# Patient Record
Sex: Male | Born: 1965 | Race: White | Hispanic: No | Marital: Single | Smoking: Never smoker
Health system: Southern US, Community
[De-identification: ages and names within clinical notes are randomized; demographics above are authoritative.]

## PROBLEM LIST (undated history)

## (undated) DIAGNOSIS — I10 Essential (primary) hypertension: Secondary | ICD-10-CM

## (undated) DIAGNOSIS — N200 Calculus of kidney: Secondary | ICD-10-CM

## (undated) DIAGNOSIS — M5432 Sciatica, left side: Secondary | ICD-10-CM

## (undated) HISTORY — PX: HERNIA REPAIR: SHX51

## (undated) HISTORY — PX: SHOULDER SURGERY: SHX246

---

## 2015-11-11 ENCOUNTER — Inpatient Hospital Stay: Admit: 2015-11-11 | Discharge: 2015-11-11 | Payer: PRIVATE HEALTH INSURANCE | Primary: Family Medicine

## 2015-11-11 DIAGNOSIS — M5432 Sciatica, left side: Secondary | ICD-10-CM

## 2015-11-11 NOTE — Progress Notes (Signed)
[facility]  [address] [place], [place] [id number]  Ph [phone]) [phone]  Fx [phone]    Plan of Care/ Statement of Necessity for Physical Therapy Services    Patient name: Joshua Palmer Start of Care: 11/11/2015   Referral source: Windell NorfolkBarron, Natalie, MD DOB: 08/02/1966    Medical Diagnosis: Sciatica, left side [M54.32]   Onset Date:chronic (1991 initial onset)    Treatment Diagnosis: back pain   Prior Hospitalization: see medical history Provider#: [id number]   Medications: Verified on Patient summary List    Comorbidities: none reported   Prior Level of Function: chronic back pain with ADL's      The Plan of Care and following information is based on the information from the initial evaluation.  Assessment/ key information: Pt is a 49 yo male presenting to clinic with c/o chronic back pain original onset 1991 after gym activity. Pain exacerbated 3 years ago after fall on ice and since this time pt has had radiating pain into left buttock, but not into LE otherwise. On exam, mechanical spine assessment with repeated movements testing reveals a tentative flexion bias of lumbar spine.  Innominates currently aligned and stable.    Problem List: pain affecting function, decrease ROM, decrease strength, decrease ADL/ functional abilitiies, decrease activity tolerance and decrease flexibility/ joint mobility   Treatment Plan may include any combination of the following: Therapeutic exercise, Therapeutic activities, Neuromuscular re-education, Physical agent/modality, Manual therapy, Aquatic therapy and Patient education  Patient / Family readiness to learn indicated by: asking questions, trying to perform skills and interest  Persons(s) to be included in education: patient (P)  Barriers to Learning/Limitations: None  Patient Goal (s): ???less pain???  Patient Self Reported Health Status: good  Rehabilitation Potential: good    Short Term Goals: To be accomplished in 2 weeks:   1. Patient will be independent and compliant with HEP to achieve other goals.  Status at eval: not independent/compliant  2. Pt willl report >/=25% improvement in symptoms to increase activity/position tolerance.  Status at eval: 0%    Long Term Goals: To be accomplished in 4 weeks:  1. Improve FOTO score to >/= 74/100 to indicate decreased pain with ADL's.  Status at eval:   2. Pt willl report >/=25% improvement in symptoms to increase activity/position tolerance.  Status at eval: 0%  3. Pt will have full, pain-free lumbar AROM to normalize ADL's.  Status at eval: limited and painful    Frequency / Duration: Patient to be seen 2 times per week for 4 weeks.    Patient/ Caregiver education and instruction: Diagnosis, prognosis, exercises     Plan of care has been reviewed with PTA    Damaris SchoonerStacye Wilian Kwong, PT 11/11/2015 11:07 AM    ________________________________________________________________________    I certify that the above Therapy Services are being furnished while the patient is under my care. I agree with the treatment plan and certify that this therapy is necessary.    Physician's Signature:____________________  Date:____________Time: _________    Please sign and return to [facility]  [address] [place], [place] [id number]  Ph [phone]) [phone]  Fx [phone]

## 2015-11-11 NOTE — Progress Notes (Signed)
PT DAILY TREATMENT NOTE 3-16    Patient Name: Joshua Palmer  Date:11/11/2015  DOB: 03/26/1966    Patient DOB Verified  Payor: OPTIMA / [place] / Product Type: HMO /    In time:1025  Out time:1100  Total Treatment Time (min): 35  Visit #: 1 of 8    Treatment Area: Sciatica, left side [M54.32]    SUBJECTIVE  Pain Level (0-10 scale): 5/10 seated at rest into left buttock  Any medication changes, allergies to medications, adverse drug reactions, diagnosis change, or new procedure performed?:  No     Yes (see summary sheet for update)  Subjective functional status/changes:    No changes reported  See POC    OBJECTIVE    Modality rationale:    Min Type Additional Details     Estim:  Unatt       IFC  Premod                        Other:  w/ice   w/heat  Position:  Location:     Estim: Att    TENS instruct  NMES                    Other:  w/US   w/ice   w/heat  Position:  Location:      Traction:  Cervical       Lumbar                        Prone          Supine                       Intermittent   Continuous Lbs:   before manual   after manual      Ultrasound: Continuous    Pulsed                             W/cm2:  Location:      Iontophoresis with dexamethasone         Location:  Take home patch    In clinic      Ice       heat    Ice massage    Laser     Anodyne Position:  Location:      Laser with stim    Other:  Position:  Location:      Vasopneumatic Device Pressure:        lo  med  hi   Temperature:  lo  med  hi    Skin assessment post-treatment:  intact redness- no adverse reaction    redness ??? adverse reaction:     27 min Eval                  Re-Eval       8 min Therapeutic Exercise:   See flow sheet : issued and reviewed HEP   Rationale: decrease pain and determine directional preference to improve the patient???s ability to normalize function     min Therapeutic Activity:    See flow sheet :         min Neuromuscular Re-education:    See flow sheet :        min Manual Therapy:           min Gait Training:  ___ feet  with ___ device on level surfaces with ___ level of assist   Rationale:          With    TE    TA    neuro    other: Patient Education:  Review HEP     Progressed/Changed HEP based on:    positioning    body mechanics    transfers    heat/ice application     other:      Other Objective/Functional Measures:    Physical Therapy Evaluation - Lumbar Spine ([facility])    SUBJECTIVE  Chief Complaint: Pt c/o chronic back pain original onset 1991 after gym activity.  Pain exacerbated 3 years ago after fall on ice and since this time pt has had radiating pain into left buttock, but not into LE otherwise.    Mechanism of injury:    Symptoms:  Pain rating (0-10):   Today:    Best:    Worst:     Contstant: varies in intensity     Intermittent:     Aggravated by:    Bending  Sitting  Standing  Walking    Moving  Cough  Sneeze  Valsalva    AM   PM  Lying:   sup    pro    sidelying    Other: running     Eased by:     Bending  Sitting  Standing  Walking    Moving  AM   PM  Lying:  sup   pro   sidelying    Other:     General Health:  Red Flags Indicated?  Yes     No   Yes  No Recent weight change (If yes, due to dieting?  Yes   No)    Yes  No Weakness in legs during walking   Yes  No Unremitting pain at night   Yes  No Abdominal pain or problems   Yes  No Rectal bleeding   Yes  No Feet more cold or painful in cold weather   Yes  No Menstrual irregularities   Yes  No Blood or pain with urination   Yes  No Dysfunction of bowel or bladder   Yes  No Recent illness within past 3 weeks (i.e, cold, flu)   Yes  No Numbness/tingling in buttock/genitalia region    Past History/Treatments:     Diagnostic Tests:  Lab work  X-rays     CT  MRI      Other:  Results: no diagnostic tests performed    Functional Status  Prior level of function: chronic back pain with ADL's  Present functional limitations: job duties as an Programme researcher, broadcasting/film/videoautomechanic, biking  What position do you sleep in?: supine vs S/L    OBJECTIVE  Posture:   Lateral Shift:  R     L      +   -  Kyphosis:  Increased  Decreased     WNL  Lordosis:   Increased  Decreased    WNL  Pelvic symmetry:  WNL     Other:    Gait:   Normal      Abnormal:    Active Movements:  N/A    Too acute    Other:  ROM % AROM % PROM Comments:pain, area   Forward flexion 40-60      Extension 20-30      SB right 20-30      SB left 20-30  Rotation right 5-10      Rotation left 5-10        Repeated Movements   Effects on present pain: produces (PR), abolishes (A), increases (incr), decreases (decr), centralizes (C), peripheral (PH), no effect (NE)   Pre-Test Sx Flexion Repeated Flexion Extension Repeated Extension Repeated SBL Repeated SBR   Sitting 1. 5/10 into left buttock  4.NE       Standing 2. NE    3.NE     Lying 5.H/L supine: increased pain and more so with LE's straight    5. Prone lie: increases pain  N/A N/A   Comments:i  Side Glide:  Sustained passive positioning test:    Neuro Screen  WNL  Myotome/Dermatome/Reflexes:  Comments:    Dural Mobility:  SLR Sitting:  R     L     +     -  @ (degrees):           Supine:  R     L     +     -  @ (degrees):   Slump Test:  R     L     +     -  @ (degrees):   Prone Knee Bend:  R     L     +     -     Palpation   Min   Mod   Severe    Location: minimal tenderness to palpation bilateral lumbar paraspinal and PSIS region   Min   Mod   Severe    Location:   Min   Mod   Severe    Location:    Stabilization Tests  Multifidus Test  Level 1: Prone abdominal draw in (Goal 6-53mmHG):  Level 2: Supported leg load supine (needle deflection at ):  Yes   No   Level 3: Unsupported leg load supine (needle deflection at ):  Yes   No     Strength   L(0-5) R (0-5) N/T   Hip Flexion (L1,2)      Knee Extension (L3,4)      Ankle Dorsiflexion (L4)      Great Toe Extension (L5)      Ankle Plantarflexion (S1)      Knee Flexion (S1,2)      Upper Abdominals      Lower Abdominals      Paraspinals      Back Rotators      Gluteus Maximus      Other         Special Tests  Lumbar:  Lumb. Compression:  Pos   Neg               Lumbar Distraction:    Pos   Neg    Quadrant:   Pos   Neg    Flex   Ext    Sacroilliac:  Gaenslen's:  R     L     +     -     Compression:  +     -     Gapping:   +     -     Thigh Thrust:  R     L     +     -     Leg Length:  +     -   Position:    Crests:    ASIS:    PSIS:    Sacral Sulcus:    Mobility:  Standing flex:     Sitting flex:     Supine to sit:     Prone knee bend:         Hip: Pearlean Brownie:   R     L     +     -     Scour:   R     L     +     -     Piriformis:  R     L     +     -          Deficits: Ober's:  R     L     +     -     Thomas:  R     L     +     -     Hamstrings 90/90:    Gastrocsoleus (to neutral): Right: Left:       Global Muscular Weakness:  Abdominals:  Quadratus Lumborum:  Paraspinals:  Other:    Other tests/comments:  6. Long sit test: ( - )        Pain Level (0-10 scale) post treatment: 2-3/10    ASSESSMENT/Changes in Function: see POC    Patient will continue to benefit from skilled PT services to modify and progress therapeutic interventions, address ROM deficits, address strength deficits, analyze and address soft tissue restrictions, analyze and cue movement patterns, analyze and modify body mechanics/ergonomics and assess and modify postural abnormalities to attain remaining goals.       See Plan of Care    See progress note/recertification    See Discharge Summary         Progress towards goals / Updated goals:  See POC    PLAN    Upgrade activities as tolerated       Continue plan of care    Update interventions per flow sheet         Discharge due to:_    Other:_      Damaris Schooner, PT 11/11/2015  10:15 AM

## 2015-11-16 ENCOUNTER — Inpatient Hospital Stay: Admit: 2015-11-16 | Discharge: 2015-11-16 | Payer: PRIVATE HEALTH INSURANCE | Primary: Family Medicine

## 2015-11-16 NOTE — Progress Notes (Signed)
PT DAILY TREATMENT NOTE 3-16    Patient Name: Joshua Palmer  Date:11/16/2015  DOB: 08/18/1966    Patient DOB Verified  Payor: OPTIMA / [place] [employer] / Product Type: HMO /    In time:9:32  Out time:10:19  Total Treatment Time (min): 47  Visit #: 2 of 8    Treatment Area: Sciatica, left side [M54.32]    SUBJECTIVE  Pain Level (0-10 scale): 4-5/10  Any medication changes, allergies to medications, adverse drug reactions, diagnosis change, or new procedure performed?:  No     Yes (see summary sheet for update)  Subjective functional status/changes:    No changes reported  "It feels no different, but the knees to chest does stretch it out."    OBJECTIVE    Modality rationale:    Min Type Additional Details     Estim:  Unatt       IFC  Premod                        Other:  w/ice   w/heat  Position:  Location:     Estim: Att    TENS instruct  NMES                    Other:  w/US   w/ice   w/heat  Position:  Location:      Traction:  Cervical       Lumbar                        Prone          Supine                       Intermittent   Continuous Lbs:   before manual   after manual      Ultrasound: Continuous    Pulsed                           1MHz   3MHz W/cm2:  Location:      Iontophoresis with dexamethasone         Location:  Take home patch    In clinic      Ice       heat    Ice massage    Laser     Anodyne Position:  Location:      Laser with stim    Other:  Position:  Location:      Vasopneumatic Device Pressure:        lo  med  hi   Temperature:  lo  med  hi    Skin assessment post-treatment:  intact redness- no adverse reaction    redness ??? adverse reaction:      min Eval                  Re-Eval        min Therapeutic Exercise:   See flow sheet :        min Therapeutic Activity:    See flow sheet :        35 min Neuromuscular Re-education:    See flow sheet :  Added prone heel presses, glutes sets, bridges with adduction, transverse abdominus (TA) bracing with Swissball #1, #2, #3    Rationale: increase strength, improve coordination, increase proprioception and decrease pain  to improve the patient???s ability to  tolerate positions and ADLs.    12 min Manual Therapy:    METs to correct posterior sacral torsion (left on right)   Rationale: decrease pain and correct joint alignment and joint mechanics to tolerate positions and ADLs.     min Gait Training:  ___ feet with ___ device on level surfaces with ___ level of assist   Rationale:          With    TE    TA    neuro    other: Patient Education:  Review HEP     Progressed/Changed HEP based on:    positioning    body mechanics    transfers    heat/ice application     other:      Other Objective/Functional Measures:   right SB more prominent in extension and symmetric in flexion  right ILA more prominent in flexion and symmetric in extension  Seated flexion test on left- posterior sacral torsion (left on right)  L4 and L5 levels aligned  Innominates aligned.     Pain Level (0-10 scale) post treatment: 1-2/10    ASSESSMENT/Changes in Function:    Pt displayed a posterior sacral torsion (left on right) that was corrected today.  All other sacral and innominate and lumbar areas are properly aligned.    Patient will continue to benefit from skilled PT services to modify and progress therapeutic interventions, address ROM deficits, address strength deficits, analyze and address soft tissue restrictions, analyze and cue movement patterns, analyze and modify body mechanics/ergonomics and assess and modify postural abnormalities to attain remaining goals.       See Plan of Care    See progress note/recertification    See Discharge Summary         Progress towards goals / Updated goals:  Short Term Goals: To be accomplished in 2 weeks:  1. Patient will be independent and compliant with HEP to achieve other goals.  Status at eval: not independent/compliant  Current status: not reassessed    2. Pt willl report >/=25% improvement in symptoms to increase  activity/position tolerance.  Status at eval: 0%  Current status: not reassessed  ??  Long Term Goals: To be accomplished in 4 weeks:  1. Improve FOTO score to >/= 74/100 to indicate decreased pain with ADL's.  Status at eval:   Current status: not reassessed    2. Pt willl report >/=25% improvement in symptoms to increase activity/position tolerance.  Status at eval: 0%  Current status: not reassessed    3. Pt will have full, pain-free lumbar AROM to normalize ADL's.  Status at eval: limited and painful  Current status: not reassessed    PLAN    Upgrade activities as tolerated       Continue plan of care    Update interventions per flow sheet         Discharge due to:_    Other:_      Candyce Churn, PT 11/16/2015  9:42 AM

## 2015-11-17 ENCOUNTER — Inpatient Hospital Stay
Admit: 2015-11-17 | Payer: PRIVATE HEALTH INSURANCE | Attending: Rehabilitative and Restorative Service Providers" | Primary: Family Medicine

## 2015-11-17 NOTE — Progress Notes (Signed)
PT DAILY TREATMENT NOTE 3-16    Patient Name: Joshua Palmer  Date:11/17/2015  DOB: 04/27/1966    Patient DOB Verified  Payor: OPTIMA / [place] [employer] / Product Type: HMO /    In time:4:45  Out time:5:40  Total Treatment Time (min): 55  Visit #: 3 of 8    Treatment Area: Sciatica, left side [M54.32]    SUBJECTIVE  Pain Level (0-10 scale): 2  Any medication changes, allergies to medications, adverse drug reactions, diagnosis change, or new procedure performed?:  No     Yes (see summary sheet for update)  Subjective functional status/changes:    No changes reported  I have nagging pain in my low back and buttocks region. i have a pulling in my back with the knee to chest exercise but it feels good when I let go.     OBJECTIVE    Modality rationale: decrease pain and increase tissue extensibility to improve the patient???s ability to improve soft tissue mobility    Min Type Additional Details     Estim:  Unatt       IFC  Premod                        Other:  w/ice   w/heat  Position:  Location:     Estim: Att    TENS instruct  NMES                    Other:  w/US   w/ice   w/heat  Position:  Location:      Traction:  Cervical       Lumbar                        Prone          Supine                       Intermittent   Continuous Lbs:   before manual   after manual      Ultrasound: Continuous    Pulsed                           1MHz   3MHz W/cm2:  Location:      Iontophoresis with dexamethasone         Location:  Take home patch    In clinic   10   Ice       heat    Ice massage    Laser     Anodyne Position:prone   Location: low back/sacral region.       Laser with stim    Other:  Position:  Location:      Vasopneumatic Device Pressure:        lo  med  hi   Temperature:  lo  med  hi    Skin assessment post-treatment:  intact redness- no adverse reaction    redness ??? adverse reaction:      min Eval                  Re-Eval       25 min Therapeutic Exercise:   See flow sheet :    Rationale: increase ROM, increase strength, improve coordination, improve balance and increase proprioception to improve the patient???s ability to improve mobility and core stability      min Therapeutic  Activity:    See flow sheet :     20 min Neuromuscular Re-education:    See flow sheet :   Rationale: increase ROM, increase strength and improve coordination  to improve the patient???s ability to improve core stability and TA contraction.      min Manual Therapy:         min Gait Training:  ___ feet with ___ device on level surfaces with ___ level of assist             With    TE    TA    neuro    other: Patient Education:  Review HEP     Progressed/Changed HEP based on:    positioning    body mechanics    transfers    heat/ice application     other:      Other Objective/Functional Measures: pelvis aligned     Pain Level (0-10 scale) post treatment: 1-2     ASSESSMENT/Changes in Function: pt tolerated progression of there-ex well. Urged to stay in pain tolerated ranges with HEP. Pt will be OOT for 2 weeks.     Patient will continue to benefit from skilled PT services to modify and progress therapeutic interventions, address functional mobility deficits, address ROM deficits, address strength deficits, analyze and address soft tissue restrictions, analyze and cue movement patterns, analyze and modify body mechanics/ergonomics and assess and modify postural abnormalities to attain remaining goals.       See Plan of Care    See progress note/recertification    See Discharge Summary         Progress towards goals / Updated goals:  Short Term Goals: To be accomplished in 2 weeks:  1. Patient will be independent and compliant with HEP to achieve other goals.  Status at eval: not independent/compliant  Current status: not reassessed  ??  2. Pt willl report >/=25% improvement in symptoms to increase activity/position tolerance.  Status at eval: 0%  Current status: not reassessed  ????   Long Term Goals: To be accomplished in 4 weeks:  1. Improve FOTO score to >/= 74/100 to indicate decreased pain with ADL's.  Status at eval:   Current status: not reassessed  ??  2. Pt willl report >/=25% improvement in symptoms to increase activity/position tolerance.  Status at eval: 0%  Current status: progressing  ??  3. Pt will have full, pain-free lumbar AROM to normalize ADL's.  Status at eval: limited and painful  Current status: not reassessed    PLAN    Upgrade activities as tolerated       Continue plan of care    Update interventions per flow sheet         Discharge due to:_    Other:_      Veda Canning, PT 11/17/2015  6:48 PM

## 2015-11-30 ENCOUNTER — Inpatient Hospital Stay: Admit: 2015-11-30 | Payer: PRIVATE HEALTH INSURANCE | Primary: Family Medicine

## 2015-11-30 DIAGNOSIS — M5432 Sciatica, left side: Secondary | ICD-10-CM

## 2015-11-30 NOTE — Progress Notes (Signed)
PT DAILY TREATMENT NOTE - MCR 3-16    Patient Name: Joshua Palmer  Date:11/30/2015  DOB: October 18, 1966    Patient DOB Verified  Payor: OPTIMA / [place] / Product Type: HMO /    In time:900  Out time: 940  Total Treatment Time (min): 40  Total Timed Codes (min): 40  1:1 Treatment Time (MC only): na   Visit #: 4 of 8    Treatment Area: Sciatica, left side [M54.32]    SUBJECTIVE  Pain Level (0-10 scale): 2  Any medication changes, allergies to medications, adverse drug reactions, diagnosis change, or new procedure performed?:  No     Yes (see summary sheet for update)  Subjective functional status/changes:    No changes reported  Symptoms are intermittent. Worse with sitting. Pain starts in L hip/ buttock feels LBP stems from compensating due to sciatic pain.    OBJECTIVE      30 min Therapeutic Exercise:   See flow sheet :   Rationale: increase ROM, increase strength and improve coordination      10 min Manual Therapy:  Functional massage to L piriformis on prone.   Rationale: decrease pain, increase ROM and increase tissue extensibility           With    TE    TA    neuro    other: Patient Education:  Review HEP     Progressed/Changed HEP based on:    positioning    body mechanics    transfers    heat/ice application     other: self massage with Tennis ball     Other Objective/Functional Measures:   Less L HS flexibility compared to R  TTP L piriformis     Pain Level (0-10 scale) post treatment: 1-2/10    ASSESSMENT/Changes in Function:   Fair response to deep sqat stretch, no increase of symptoms with TE.  Good response to MT with decrease of muscle tightness.    Patient will continue to benefit from skilled PT services to modify and progress therapeutic interventions, address functional mobility deficits, address ROM deficits and address strength deficits to attain remaining goals.       See Plan of Care    See progress note/recertification    See Discharge Summary          Progress towards goals / Updated goals:  Short Term Goals: To be accomplished in 2 weeks:  1. Patient will be independent and compliant with HEP to achieve other goals.  Status at eval: not independent/compliant  Current status: not reassessed  ????  2. Pt willl report >/=25% improvement in symptoms to increase activity/position tolerance.  Status at eval: 0%  Current status: not reassessed  ????  Long Term Goals: To be accomplished in 4 weeks:  1. Improve FOTO score to >/= 74/100 to indicate decreased pain with ADL's.  Status at eval: ??  Current status: not reassessed  ????  2. Pt willl report >/=25% improvement in symptoms to increase activity/position tolerance.  Status at eval: 0%  Current status: progressing  ??  3. Pt will have full, pain-free lumbar AROM to normalize ADL's.  Status at eval: limited and painful  Current status: not reassessed  ??    PLAN    Upgrade activities as tolerated       Continue plan of care    Update interventions per flow sheet         Discharge due to:_    Other:_  Beatris SiJames D Dashonna Chagnon, PTA 11/30/2015  9:14 AM

## 2015-12-02 ENCOUNTER — Encounter: Payer: PRIVATE HEALTH INSURANCE | Primary: Family Medicine

## 2015-12-02 NOTE — Progress Notes (Signed)
[facility]  [address] 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  Pt only seen for 3 visits after initial eval over the past month with last visit seen on 11/30/15.  Pt called on 12/01/15 to request D/C.    Short Term Goals: To be accomplished in 2 weeks:  1. Patient will be independent and compliant with HEP to achieve other goals.  Status at eval: not independent/compliant  Current status: not reassessed  ????  2. Pt willl report >/=25% improvement in symptoms to increase activity/position tolerance.  Status at eval: 0%  Current status: not reassessed  ????  Long Term Goals: To be accomplished in 4 weeks:  1. Improve FOTO score to >/= 74/100 to indicate decreased pain with ADL's.  Status at eval: ??  Current status: not reassessed  ????  2. Pt willl report >/=25% improvement in symptoms to increase activity/position tolerance.  Status at eval: 0%  Current status: progressing  ????  3. Pt will have full, pain-free lumbar AROM to normalize ADL's.  Status at eval: limited and painful  Current status: not reassessed  ??      ASSESSMENT/RECOMMENDATIONS:  Discontinue therapy: Patient has reached or is progressing toward set goals      Patient is non-compliant or has abdicated      Due to lack of appreciable progress towards set goals    Damaris SchoonerStacye Dejia Ebron, PT 12/02/2015 12:53 PM

## 2015-12-04 ENCOUNTER — Inpatient Hospital Stay: Admit: 2015-12-04 | Payer: PRIVATE HEALTH INSURANCE | Primary: Family Medicine

## 2015-12-04 ENCOUNTER — Encounter

## 2015-12-04 DIAGNOSIS — M47896 Other spondylosis, lumbar region: Secondary | ICD-10-CM

## 2015-12-07 ENCOUNTER — Encounter
Payer: PRIVATE HEALTH INSURANCE | Attending: Rehabilitative and Restorative Service Providers" | Primary: Family Medicine

## 2015-12-09 ENCOUNTER — Encounter: Payer: PRIVATE HEALTH INSURANCE | Primary: Family Medicine

## 2015-12-14 ENCOUNTER — Encounter
Payer: PRIVATE HEALTH INSURANCE | Attending: Rehabilitative and Restorative Service Providers" | Primary: Family Medicine

## 2015-12-15 ENCOUNTER — Encounter
Payer: PRIVATE HEALTH INSURANCE | Attending: Rehabilitative and Restorative Service Providers" | Primary: Family Medicine

## 2015-12-22 ENCOUNTER — Encounter: Payer: PRIVATE HEALTH INSURANCE | Primary: Family Medicine

## 2015-12-23 ENCOUNTER — Encounter: Payer: PRIVATE HEALTH INSURANCE | Primary: Family Medicine

## 2017-09-19 ENCOUNTER — Emergency Department (HOSPITAL_COMMUNITY)
Admission: EM | Admit: 2017-09-19 | Discharge: 2017-09-19 | Disposition: A | Discharge status: Home / Self Care | Payer: 59 | Attending: Emergency Medicine | Admitting: Emergency Medicine

## 2017-09-19 ENCOUNTER — Encounter (HOSPITAL_COMMUNITY): Payer: Self-pay

## 2017-09-19 ENCOUNTER — Emergency Department (HOSPITAL_COMMUNITY): Payer: 59

## 2017-09-19 DIAGNOSIS — Z79899 Other long term (current) drug therapy: Secondary | ICD-10-CM | POA: Insufficient documentation

## 2017-09-19 DIAGNOSIS — R1030 Lower abdominal pain, unspecified: Secondary | ICD-10-CM | POA: Diagnosis present

## 2017-09-19 DIAGNOSIS — I10 Essential (primary) hypertension: Secondary | ICD-10-CM | POA: Insufficient documentation

## 2017-09-19 DIAGNOSIS — R739 Hyperglycemia, unspecified: Secondary | ICD-10-CM

## 2017-09-19 DIAGNOSIS — N201 Calculus of ureter: Secondary | ICD-10-CM | POA: Diagnosis not present

## 2017-09-19 DIAGNOSIS — N200 Calculus of kidney: Secondary | ICD-10-CM

## 2017-09-19 DIAGNOSIS — N23 Unspecified renal colic: Secondary | ICD-10-CM | POA: Insufficient documentation

## 2017-09-19 HISTORY — DX: Essential (primary) hypertension: I10

## 2017-09-19 LAB — COMPREHENSIVE METABOLIC PANEL
ALT: 19 U/L (ref 17–63)
AST: 26 U/L (ref 15–41)
Albumin: 4.5 g/dL (ref 3.5–5.0)
Alkaline Phosphatase: 51 U/L (ref 38–126)
Anion gap: 9 (ref 5–15)
BUN: 16 mg/dL (ref 6–20)
CHLORIDE: 103 mmol/L (ref 101–111)
CO2: 25 mmol/L (ref 22–32)
Calcium: 9.4 mg/dL (ref 8.9–10.3)
Creatinine, Ser: 1.05 mg/dL (ref 0.61–1.24)
Glucose, Bld: 142 mg/dL — ABNORMAL HIGH (ref 65–99)
POTASSIUM: 3.6 mmol/L (ref 3.5–5.1)
Sodium: 137 mmol/L (ref 135–145)
Total Bilirubin: 1.4 mg/dL — ABNORMAL HIGH (ref 0.3–1.2)
Total Protein: 7.1 g/dL (ref 6.5–8.1)

## 2017-09-19 LAB — CBC
HEMATOCRIT: 41.6 % (ref 39.0–52.0)
Hemoglobin: 14.4 g/dL (ref 13.0–17.0)
MCH: 31.6 pg (ref 26.0–34.0)
MCHC: 34.6 g/dL (ref 30.0–36.0)
MCV: 91.2 fL (ref 78.0–100.0)
Platelets: 182 10*3/uL (ref 150–400)
RBC: 4.56 MIL/uL (ref 4.22–5.81)
RDW: 11.9 % (ref 11.5–15.5)
WBC: 11.8 10*3/uL — AB (ref 4.0–10.5)

## 2017-09-19 LAB — URINALYSIS, ROUTINE W REFLEX MICROSCOPIC
BACTERIA UA: NONE SEEN
BILIRUBIN URINE: NEGATIVE
Glucose, UA: NEGATIVE mg/dL
KETONES UR: 20 mg/dL — AB
LEUKOCYTES UA: NEGATIVE
Nitrite: NEGATIVE
PH: 6 (ref 5.0–8.0)
Protein, ur: 100 mg/dL — AB
SQUAMOUS EPITHELIAL / LPF: NONE SEEN
Specific Gravity, Urine: 1.021 (ref 1.005–1.030)
WBC, UA: NONE SEEN WBC/hpf (ref 0–5)

## 2017-09-19 LAB — LIPASE, BLOOD: LIPASE: 30 U/L (ref 11–51)

## 2017-09-19 MED ORDER — ONDANSETRON HCL 4 MG/2ML IJ SOLN
4.0000 mg | Freq: Once | INTRAMUSCULAR | Status: AC
  Administered 2017-09-19: 4 mg via INTRAVENOUS
  Filled 2017-09-19: qty 2

## 2017-09-19 MED ORDER — TAMSULOSIN HCL 0.4 MG PO CAPS: 0.4000 mg | ORAL_CAPSULE | Freq: Every day | ORAL | 0 refills | Status: DC

## 2017-09-19 MED ORDER — SODIUM CHLORIDE 0.9 % IV BOLUS (SEPSIS)
1000.0000 mL | Freq: Once | INTRAVENOUS | Status: AC
  Administered 2017-09-19: 1000 mL via INTRAVENOUS

## 2017-09-19 MED ORDER — KETOROLAC TROMETHAMINE 30 MG/ML IJ SOLN
30.0000 mg | Freq: Once | INTRAMUSCULAR | Status: AC
  Administered 2017-09-19: 30 mg via INTRAVENOUS
  Filled 2017-09-19: qty 1

## 2017-09-19 MED ORDER — OXYCODONE-ACETAMINOPHEN 5-325 MG PO TABS: 1.0000 | ORAL_TABLET | ORAL | 0 refills | Status: AC | PRN

## 2017-09-19 MED ORDER — ONDANSETRON HCL 4 MG PO TABS: 4.0000 mg | ORAL_TABLET | Freq: Four times a day (QID) | ORAL | 0 refills | Status: AC | PRN

## 2017-09-19 NOTE — ED Triage Notes (Signed)
Pt state he started having sever left side abdominal pain a few hours ago; pt states he vomited 1 and had 5 BM in the last hour; pt a&ox 4 on arrival.

## 2017-09-19 NOTE — Discharge Instructions (Signed)
Return if you start running a fever, or if pain is not being adequately controlled at home.  Your blood sugar was a little high today - 142. This needs to be watched closely - it may indicate that you are pre-diabetic.

## 2017-09-19 NOTE — ED Provider Notes (Signed)
[facility]-EMERGENCY DEPT Provider Note   CSN: [id number] Arrival date & time: 09/19/17  0020     History   Chief Complaint Chief Complaint  Patient presents with  . Abdominal Pain    HPI Julian Anderson is a 51 y.o. male.  The history is provided by the patient.  He had onset last night of severe pain in the suprapubic area with some radiation to the left flank. He states that it felt as if he had been kicked in the groin. He was sweating profusely. There is associated rectal urgency and he defecated  several times without actual diarrhea. There is also associated nausea and vomiting. He denies fever or chills. He was unable to find a comfortable position. Pain was rated at 10/10. Pain has subsided somewhat slurred is now only 5/10. He has never had any pain like this before, but he states his father had kidney stones.  Past Medical History:  Diagnosis Date  . Hypertension     There are no active problems to display for this patient.   History reviewed. No pertinent surgical history.     Home Medications    Prior to Admission medications   Not on File    Family History No family history on file.  Social History Social History  Substance Use Topics  . Smoking status: Never Smoker  . Smokeless tobacco: Not on file  . Alcohol use Yes     Allergies   Patient has no allergy information on record.   Review of Systems Review of Systems  All other systems reviewed and are negative.    Physical Exam Updated Vital Signs BP (!) 156/91 (BP Location: Right Arm)   Pulse (!) 59   Temp 98.9 F (37.2 C) (Oral)   Resp 16   Ht  (1.93 m)   Wt 90.7 kg (200 lb)   SpO2 98%   BMI 24.34 kg/m   Physical Exam  Nursing note and vitals reviewed.  51 year old male, resting comfortably and in no acute distress. Vital signs are is significant for hypertension. Oxygen saturation is 98%, which is normal. Head is normocephalic and atraumatic. PERRLA, EOMI. Oropharynx is  clear. Neck is nontender and supple without adenopathy or JVD. Back is nontender and there is no CVA tenderness. Lungs are clear without rales, wheezes, or rhonchi. Chest is nontender. Heart has regular rate and rhythm without murmur. Abdomen is soft, flat, nontender without masses or hepatosplenomegaly and peristalsis is hypoactive. Extremities have no cyanosis or edema, full range of motion is present. Skin is warm and dry without rash. Neurologic: Mental status is normal, cranial nerves are intact, there are no motor or sensory deficits.  ED Treatments / Results  Labs (all labs ordered are listed, but only abnormal results are displayed) Labs Reviewed  COMPREHENSIVE METABOLIC PANEL - Abnormal; Notable for the following:       Result Value   Glucose, Bld 142 (*)    Total Bilirubin 1.4 (*)    All other components within normal limits  CBC - Abnormal; Notable for the following:    WBC 11.8 (*)    All other components within normal limits  URINALYSIS, ROUTINE W REFLEX MICROSCOPIC - Abnormal; Notable for the following:    APPearance HAZY (*)    Hgb urine dipstick LARGE (*)    Ketones, ur 20 (*)    Protein, ur 100 (*)    All other components within normal limits  LIPASE, BLOOD   Radiology Ct Renal  Stone Study  Result Date: 09/19/2017 CLINICAL DATA:  Left lower quadrant pain with nausea vomiting EXAM: CT ABDOMEN AND PELVIS WITHOUT CONTRAST TECHNIQUE: Multidetector CT imaging of the abdomen and pelvis was performed following the standard protocol without IV contrast. COMPARISON:  None. FINDINGS: Lower chest: Negative Hepatobiliary: Normal liver. Contracted gallbladder without abnormality. No biliary dilatation. Pancreas: Negative Spleen: Negative Adrenals/Urinary Tract: Left hydronephrosis and hydroureter. Left perinephric edema. Obstructing stone distal left ureter measuring 4 x 8 mm. Additional small stones left lower pole measuring up to 6 mm. No right renal calculi.  Normal urinary  bladder. Stomach/Bowel: Negative for bowel obstruction. No bowel mass or edema. Vascular/Lymphatic: Negative Reproductive: Mild prostate enlargement Other: Right inguinal hernia repair with mesh. Musculoskeletal: Disc degeneration L5-S1. IMPRESSION: 4 x 8 mm obstructing stone distal left ureter. Additional small stones left lower pole Electronically Signed   By: Marlan Palau M.D.   On: 09/19/2017 07:49    Procedures Procedures (including critical care time)  Medications Ordered in ED Medications  ondansetron (ZOFRAN) injection 4 mg (4 mg Intravenous Given 09/19/17 0622)  ketorolac (TORADOL) 30 MG/ML injection 30 mg (30 mg Intravenous Given 09/19/17 0622)  sodium chloride 0.9 % bolus 1,000 mL (0 mLs Intravenous Stopped 09/19/17 0752)     Initial Impression / Assessment and Plan / ED Course  I have reviewed the triage vital signs and the nursing notes.  Pertinent labs & imaging results that were available during my care of the patient were reviewed by me and considered in my medical decision making (see chart for details).  Abdominal pain suggestive of renal colic. Urinalysis does have too numerous to count WBCs. Remainder of laboratory workup is significant only for borderline glucose of 142. He'll be given IV hydration, ondansetron, ketorolac and sent for renal stone protocol CT scan.  CT shows 4 mm distal left ureteral calculus. He is discharged with prescription oxycodone-acetaminophen, tamsulosin, and ondansetron. He is referred to urology for follow-up. Also advised of mild glucose elevation and need to repeat this periodically to evaluate for possible diabetes.  Final Clinical Impressions(s) / ED Diagnoses   Final diagnoses:  Ureteral colic  Ureterolithiasis  Nephrolithiasis  Hyperglycemia    New Prescriptions New Prescriptions   ONDANSETRON (ZOFRAN) 4 MG TABLET    Take 1 tablet (4 mg total) by mouth every 6 (six) hours as needed for nausea or vomiting.    OXYCODONE-ACETAMINOPHEN (PERCOCET) 5-325 MG TABLET    Take 1 tablet by mouth every 4 (four) hours as needed for moderate pain.   TAMSULOSIN (FLOMAX) 0.4 MG CAPS CAPSULE    Take 1 capsule (0.4 mg total) by mouth daily.     Dione Booze, MD 09/19/17 [phone]

## 2017-10-16 ENCOUNTER — Encounter (HOSPITAL_COMMUNITY): Payer: Self-pay

## 2017-10-16 ENCOUNTER — Emergency Department (HOSPITAL_COMMUNITY)
Admission: EM | Admit: 2017-10-16 | Discharge: 2017-10-16 | Disposition: A | Discharge status: Home / Self Care | Payer: 59 | Attending: Emergency Medicine | Admitting: Emergency Medicine

## 2017-10-16 ENCOUNTER — Emergency Department (HOSPITAL_COMMUNITY): Payer: 59

## 2017-10-16 DIAGNOSIS — Z79899 Other long term (current) drug therapy: Secondary | ICD-10-CM | POA: Insufficient documentation

## 2017-10-16 DIAGNOSIS — N201 Calculus of ureter: Secondary | ICD-10-CM | POA: Diagnosis not present

## 2017-10-16 DIAGNOSIS — I10 Essential (primary) hypertension: Secondary | ICD-10-CM | POA: Diagnosis not present

## 2017-10-16 DIAGNOSIS — R109 Unspecified abdominal pain: Secondary | ICD-10-CM

## 2017-10-16 DIAGNOSIS — R1032 Left lower quadrant pain: Secondary | ICD-10-CM | POA: Diagnosis present

## 2017-10-16 DIAGNOSIS — N23 Unspecified renal colic: Secondary | ICD-10-CM | POA: Diagnosis not present

## 2017-10-16 HISTORY — DX: Calculus of kidney: N20.0

## 2017-10-16 LAB — URINALYSIS, ROUTINE W REFLEX MICROSCOPIC
BACTERIA UA: NONE SEEN
Bilirubin Urine: NEGATIVE
Glucose, UA: NEGATIVE mg/dL
Ketones, ur: 80 mg/dL — AB
LEUKOCYTES UA: NEGATIVE
Nitrite: NEGATIVE
PH: 6 (ref 5.0–8.0)
Protein, ur: 30 mg/dL — AB
SPECIFIC GRAVITY, URINE: 1.024 (ref 1.005–1.030)

## 2017-10-16 LAB — I-STAT CHEM 8, ED
BUN: 19 mg/dL (ref 6–20)
CREATININE: 1.2 mg/dL (ref 0.61–1.24)
Calcium, Ion: 1.12 mmol/L — ABNORMAL LOW (ref 1.15–1.40)
Chloride: 98 mmol/L — ABNORMAL LOW (ref 101–111)
Glucose, Bld: 107 mg/dL — ABNORMAL HIGH (ref 65–99)
HEMATOCRIT: 42 % (ref 39.0–52.0)
Hemoglobin: 14.3 g/dL (ref 13.0–17.0)
POTASSIUM: 3.9 mmol/L (ref 3.5–5.1)
SODIUM: 136 mmol/L (ref 135–145)
TCO2: 28 mmol/L (ref 22–32)

## 2017-10-16 MED ORDER — HYDROCODONE-ACETAMINOPHEN 5-325 MG PO TABS: 1.0000 | ORAL_TABLET | ORAL | 0 refills | Status: AC | PRN

## 2017-10-16 MED ORDER — KETOROLAC TROMETHAMINE 60 MG/2ML IM SOLN
60.0000 mg | Freq: Once | INTRAMUSCULAR | Status: AC
  Administered 2017-10-16: 60 mg via INTRAMUSCULAR
  Filled 2017-10-16: qty 2

## 2017-10-16 MED ORDER — ONDANSETRON 4 MG PO TBDP: 4.0000 mg | ORAL_TABLET | Freq: Three times a day (TID) | ORAL | 0 refills | Status: AC | PRN

## 2017-10-16 MED ORDER — TAMSULOSIN HCL 0.4 MG PO CAPS
0.4000 mg | ORAL_CAPSULE | Freq: Once | ORAL | Status: AC
  Administered 2017-10-16: 0.4 mg via ORAL
  Filled 2017-10-16: qty 1

## 2017-10-16 MED ORDER — SODIUM CHLORIDE 0.9 % IV BOLUS (SEPSIS)
1000.0000 mL | Freq: Once | INTRAVENOUS | Status: AC
  Administered 2017-10-16: 1000 mL via INTRAVENOUS

## 2017-10-16 MED ORDER — MORPHINE SULFATE (PF) 4 MG/ML IV SOLN
8.0000 mg | Freq: Once | INTRAVENOUS | Status: AC
  Administered 2017-10-16: 8 mg via INTRAMUSCULAR
  Filled 2017-10-16: qty 2

## 2017-10-16 MED ORDER — ONDANSETRON 8 MG PO TBDP
8.0000 mg | ORAL_TABLET | Freq: Once | ORAL | Status: AC
  Administered 2017-10-16: 8 mg via ORAL
  Filled 2017-10-16: qty 1

## 2017-10-16 MED ORDER — TAMSULOSIN HCL 0.4 MG PO CAPS: 0.4000 mg | ORAL_CAPSULE | Freq: Every day | ORAL | 0 refills | Status: AC

## 2017-10-16 NOTE — ED Notes (Signed)
ED Provider at bedside. 

## 2017-10-16 NOTE — ED Triage Notes (Signed)
Patient states he has known kidney stones and is now c/o left flank pain. Patient states he took a Percocet at 1100.

## 2017-10-16 NOTE — Discharge Instructions (Signed)
Call the office to set up an appointment this week  If the office schedules you for lithotripsy, stop all NSAID medications like ibuprofen, alleve, and aspirin

## 2017-10-16 NOTE — ED Notes (Signed)
Patient transported to CT 

## 2017-10-16 NOTE — ED Notes (Signed)
Pt attempting to use restroom

## 2017-10-16 NOTE — ED Notes (Signed)
Pt informed urine sample is needed, urinal given 

## 2017-10-16 NOTE — ED Provider Notes (Signed)
[facility]-EMERGENCY DEPT Provider Note   CSN: [id number] Arrival date & time: 10/16/17  1358     History   Chief Complaint Chief Complaint  Patient presents with  . Flank Pain    HPI Benna DunksJason Galicia is a 51 y.o. male.  HPI Pt was seen at 1420. Per pt, c/o sudden onset and persistence of waxing and waning left sided flank "pain" that began several weeks ago.  Pt describes the pain as "like my last kidney stone," and radiating into the left side of his abd.  Has been associated with multiple intermittent episodes of N/V.  Denies testicular pain/swelling, no dysuria/hematuria, no abd pain, no diarrhea, no black or blood in emesis, no CP/SOB, no fevers, no rash. The symptoms have been associated with no other complaints. The patient has a significant history of similar symptoms previously, recently being evaluated for this complaint and one prior evaluation for same. Pt was dx with kidney stone, has not f/u with Uro MD.     Past Medical History:  Diagnosis Date  . Hypertension   . Kidney stone     There are no active problems to display for this patient.   Past Surgical History:  Procedure Laterality Date  . HERNIA REPAIR    . SHOULDER SURGERY         Home Medications    Prior to Admission medications   Medication Sig Start Date End Date Taking? Authorizing Provider  lisinopril (PRINIVIL,ZESTRIL) 10 MG tablet Take 10 mg by mouth daily.    [provider]  ondansetron (ZOFRAN) 4 MG tablet Take 1 tablet (4 mg total) by mouth every 6 (six) hours as needed for nausea or vomiting. 09/19/17   Dione BoozeGlick, David, MD  oxyCODONE-acetaminophen (PERCOCET) 5-325 MG tablet Take 1 tablet by mouth every 4 (four) hours as needed for moderate pain. 09/19/17   Dione BoozeGlick, David, MD  tamsulosin (FLOMAX) 0.4 MG CAPS capsule Take 1 capsule (0.4 mg total) by mouth daily. 09/19/17   Dione BoozeGlick, David, MD    Family History Family History  Problem Relation Age of Onset  .  Cancer Mother     Social History Social History  Substance Use Topics  . Smoking status: Never Smoker  . Smokeless tobacco: Never Used  . Alcohol use Yes     Allergies   Patient has no known allergies.   Review of Systems Review of Systems ROS: Statement: All systems negative except as marked or noted in the HPI; Constitutional: Negative for fever and chills. ; ; Eyes: Negative for eye pain, redness and discharge. ; ; ENMT: Negative for ear pain, hoarseness, nasal congestion, sinus pressure and sore throat. ; ; Cardiovascular: Negative for chest pain, palpitations, diaphoresis, dyspnea and peripheral edema. ; ; Respiratory: Negative for cough, wheezing and stridor. ; ; Gastrointestinal: +N/V. Negative for diarrhea, abdominal pain, blood in stool, hematemesis, jaundice and rectal bleeding. . ; ; Genitourinary: +flank pain. Negative for dysuria and hematuria. ; ; Genital:  No penile drainage or rash, no testicular pain or swelling, no scrotal rash or swelling. ;; Musculoskeletal: Negative for back pain and neck pain. Negative for swelling and trauma.; ; Skin: Negative for pruritus, rash, abrasions, blisters, bruising and skin lesion.; ; Neuro: Negative for headache, lightheadedness and neck stiffness. Negative for weakness, altered level of consciousness, altered mental status, extremity weakness, paresthesias, involuntary movement, seizure and syncope.       Physical Exam Updated Vital Signs BP (!) 158/99 (BP Location: Left Arm)   Pulse  68   Temp 98 F (36.7 C) (Oral)   Resp 14   Ht 6\' 4"  (1.93 m)   Wt 90.7 kg (200 lb)   SpO2 100%   BMI 24.34 kg/m   Physical Exam 1425: Physical examination:  Nursing notes reviewed; Vital signs and O2 SAT reviewed;  Constitutional: Well developed, Well nourished, Well hydrated, Uncomfortable appearing; Head:  Normocephalic, atraumatic; Eyes: EOMI, PERRL, No scleral icterus; ENMT: Mouth and pharynx normal, Mucous membranes moist; Neck: Supple, Full  range of motion, No lymphadenopathy; Cardiovascular: Regular rate and rhythm, No gallop; Respiratory: Breath sounds clear & equal bilaterally, No wheezes.  Speaking full sentences with ease, Normal respiratory effort/excursion; Chest: Nontender, Movement normal; Abdomen: Soft, Nontender, Nondistended, Normal bowel sounds; Genitourinary: No CVA tenderness; Spine:  No midline CS, TS, LS tenderness.;; Extremities: Pulses normal, No tenderness, No edema, No calf edema or asymmetry.; Neuro: AA&Ox3, Major CN grossly intact.  Speech clear. No gross focal motor or sensory deficits in extremities.; Skin: Color normal, Warm, Dry.   ED Treatments / Results  Labs (all labs ordered are listed, but only abnormal results are displayed)   EKG  EKG Interpretation None       Radiology   Procedures Procedures (including critical care time)  Medications Ordered in ED Medications  ketorolac (TORADOL) injection 60 mg (not administered)  morphine 4 MG/ML injection 8 mg (8 mg Intramuscular Given 10/16/17 1434)  ondansetron (ZOFRAN-ODT) disintegrating tablet 8 mg (8 mg Oral Given 10/16/17 1429)     Initial Impression / Assessment and Plan / ED Course  I have reviewed the triage vital signs and the nursing notes.  Pertinent labs & imaging results that were available during my care of the patient were reviewed by me and considered in my medical decision making (see chart for details).  MDM Reviewed: previous chart, nursing note and vitals Interpretation: CT scan and labs   Results for orders placed or performed during the hospital encounter of 10/16/17  Urinalysis, Routine w reflex microscopic- may I&O cath if menses  Result Value Ref Range   Color, Urine YELLOW YELLOW   APPearance CLEAR CLEAR   Specific Gravity, Urine 1.024 1.005 - 1.030   pH 6.0 5.0 - 8.0   Glucose, UA NEGATIVE NEGATIVE mg/dL   Hgb urine dipstick LARGE (A) NEGATIVE   Bilirubin Urine NEGATIVE NEGATIVE   Ketones, ur 80 (A)  NEGATIVE mg/dL   Protein, ur 30 (A) NEGATIVE mg/dL   Nitrite NEGATIVE NEGATIVE   Leukocytes, UA NEGATIVE NEGATIVE   RBC / HPF 6-30 0 - 5 RBC/hpf   WBC, UA 0-5 0 - 5 WBC/hpf   Bacteria, UA NONE SEEN NONE SEEN   Squamous Epithelial / LPF 0-5 (A) NONE SEEN   Mucus PRESENT   I-stat Chem 8, ED  Result Value Ref Range   Sodium 136 135 - 145 mmol/L   Potassium 3.9 3.5 - 5.1 mmol/L   Chloride 98 (L) 101 - 111 mmol/L   BUN 19 6 - 20 mg/dL   Creatinine, Ser 3.08 0.61 - 1.24 mg/dL   Glucose, Bld 657 (H) 65 - 99 mg/dL   Calcium, Ion 8.46 (L) 1.15 - 1.40 mmol/L   TCO2 28 22 - 32 mmol/L   Hemoglobin 14.3 13.0 - 17.0 g/dL   HCT 96.2 95.2 - 84.1 %   Ct Renal Stone Study Result Date: 10/16/2017 CLINICAL DATA:  Distal left ureteral stone with persistent left flank pain EXAM: CT ABDOMEN AND PELVIS WITHOUT CONTRAST TECHNIQUE: Multidetector CT imaging of  the abdomen and pelvis was performed following the standard protocol without IV contrast. COMPARISON:  09/19/2017 FINDINGS: Lower chest: No acute abnormality. Hepatobiliary: No focal liver abnormality is seen. No gallstones, gallbladder wall thickening, or biliary dilatation. Pancreas: Unremarkable. No pancreatic ductal dilatation or surrounding inflammatory changes. Spleen: Normal in size without focal abnormality. Adrenals/Urinary Tract: The adrenal glands are within normal limits. The right kidney shows no calculi or obstructive changes. The left kidney demonstrates multiple nonobstructing stones similar to that seen on the prior exam. The degree of hydronephrosis has increased in the interval from the prior exam and extends distally to the level of the distal ureter where an 8 mm stone is identified causing the obstructive change. The bladder is partially distended. Stomach/Bowel: The appendix is not well visualized although no inflammatory changes to suggest appendicitis are seen. No obstructive changes are noted. Vascular/Lymphatic: No significant vascular  findings are present. No enlarged abdominal or pelvic lymph nodes. Reproductive: Prostate is unremarkable. Other: Postoperative changes are noted in the right inguinal region consistent with prior hernia repair. Musculoskeletal: No acute bony abnormality is noted. IMPRESSION: Stable appearing 8 mm distal left ureteral stone with increasing hydronephrosis when compared with the prior study. Nonobstructing renal calculi on the left stable in appearance from the prior study. No other focal abnormality is noted. Electronically Signed   By: Alcide Clever M.D.   On: 10/16/2017 15:05   Ct Renal Stone Study Result Date: 09/19/2017 CLINICAL DATA:  Left lower quadrant pain with nausea vomiting EXAM: CT ABDOMEN AND PELVIS WITHOUT CONTRAST TECHNIQUE: Multidetector CT imaging of the abdomen and pelvis was performed following the standard protocol without IV contrast. COMPARISON:  None. FINDINGS: Lower chest: Negative Hepatobiliary: Normal liver. Contracted gallbladder without abnormality. No biliary dilatation. Pancreas: Negative Spleen: Negative Adrenals/Urinary Tract: Left hydronephrosis and hydroureter. Left perinephric edema. Obstructing stone distal left ureter measuring 4 x 8 mm. Additional small stones left lower pole measuring up to 6 mm. No right renal calculi.  Normal urinary bladder. Stomach/Bowel: Negative for bowel obstruction. No bowel mass or edema. Vascular/Lymphatic: Negative Reproductive: Mild prostate enlargement Other: Right inguinal hernia repair with mesh. Musculoskeletal: Disc degeneration L5-S1. IMPRESSION: 4 x 8 mm obstructing stone distal left ureter. Additional small stones left lower pole Electronically Signed   By: Marlan Palau M.D.   On: 09/19/2017 07:49     1730:  BUN/Cr normal. UA without infection. CT scan with increasing hydronephrosis with 8mm ureteral stone. Pt more comfortable after IM morphine and IM toradol. T/C to Uro Dr. Liliane Shi, case discussed, including:  HPI, pertinent PM/SHx,  VS/PE, dx testing, ED course and treatment:  He will review CT scan and call back.  1750:  Sign out to Dr. Erma Heritage.        Final Clinical Impressions(s) / ED Diagnoses   Final diagnoses:  None    New Prescriptions New Prescriptions   No medications on file     Samuel Jester, DO 10/16/17 1751

## 2017-10-17 NOTE — ED Provider Notes (Signed)
Assumed care from Dr. Clarene DukeMcManus at 4 PM. Briefly, the patient is a 51 y.o. male with PMHx of  has a past medical history of Hypertension and Kidney stone. here with flank pain 2/2 known 8 mm stone, awaiting Urology consult.   Labs Reviewed  URINALYSIS, ROUTINE W REFLEX MICROSCOPIC - Abnormal; Notable for the following:       Result Value   Hgb urine dipstick LARGE (*)    Ketones, ur 80 (*)    Protein, ur 30 (*)    Squamous Epithelial / LPF 0-5 (*)    All other components within normal limits  I-STAT CHEM 8, ED - Abnormal; Notable for the following:    Chloride 98 (*)    Glucose, Bld 107 (*)    Calcium, Ion 1.12 (*)    All other components within normal limits    Course of Care: Pain completely resolved in ED. UA without UTI. Renal fxn is at baseline. Discussed case with Dr. Liliane ShiWinter of [facility] - will d/c with outpt follow-up. Pt is in agreement. Return precautions given. He is tolerating PO, well appearing, w/o signs of infection or complication.     Shaune PollackIsaacs, Gurvir Schrom, MD 10/17/17 [phone]

## 2019-10-17 IMAGING — CT CT RENAL STONE PROTOCOL
2 of 3 series · 16 of 46 positions shown, 18 images · non-contrast
Comparison: 09/19/2017

CLINICAL DATA: Distal left ureteral stone with persistent left
flank pain

EXAM:
CT ABDOMEN AND PELVIS WITHOUT CONTRAST
TECHNIQUE: Multidetector CT imaging of the abdomen and pelvis was performed
following the standard protocol without IV contrast.

[Series 3: coronal · coronal · 0.74mm/px · 3 of 113 slices shown]
[im 38/113  soft-tissue]
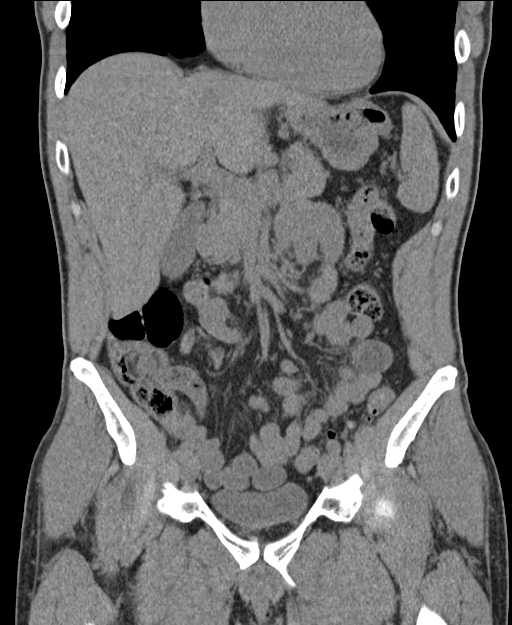
[im 50/113  soft-tissue]
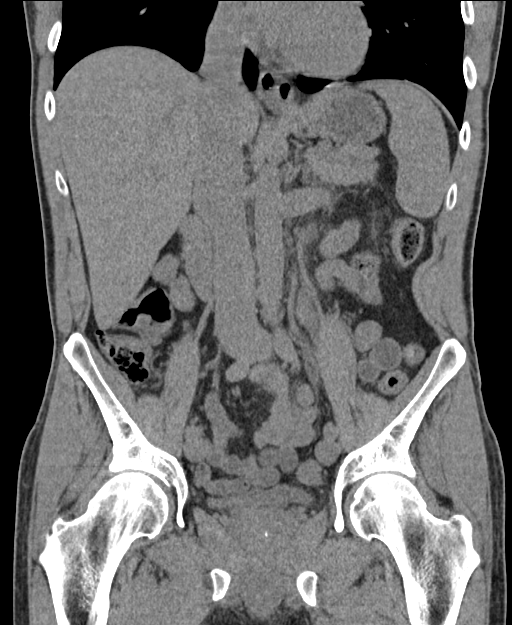
[im 63/113  soft-tissue]
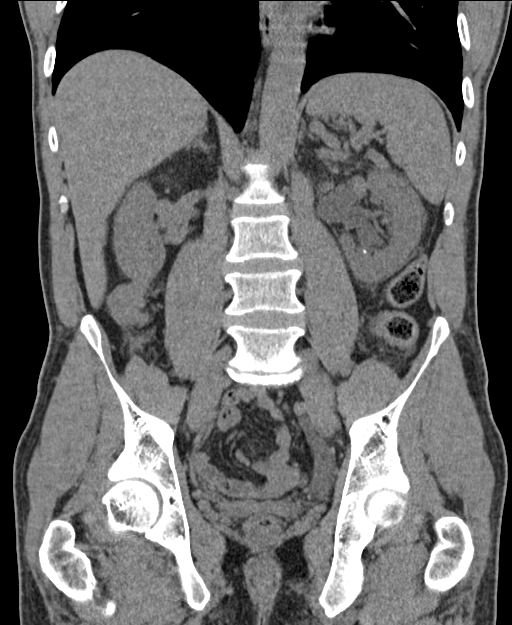

[Series 6: lung · axial · 0.74mm/px · z∈[-6,+88]mm · 13 of 55 slices shown, 15 images]
[im 4/55  soft-tissue]
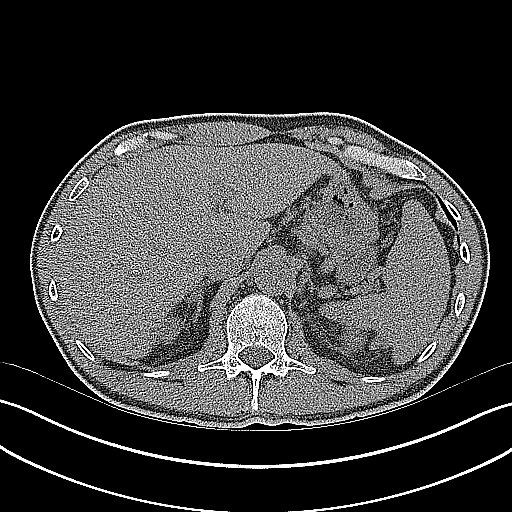
[im 4/55  bone]
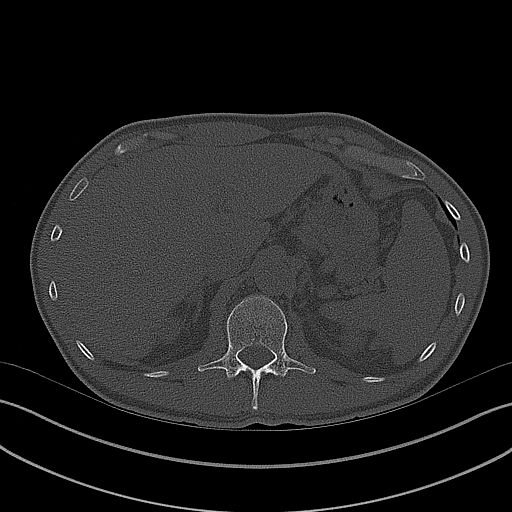
[im 7/55  soft-tissue]
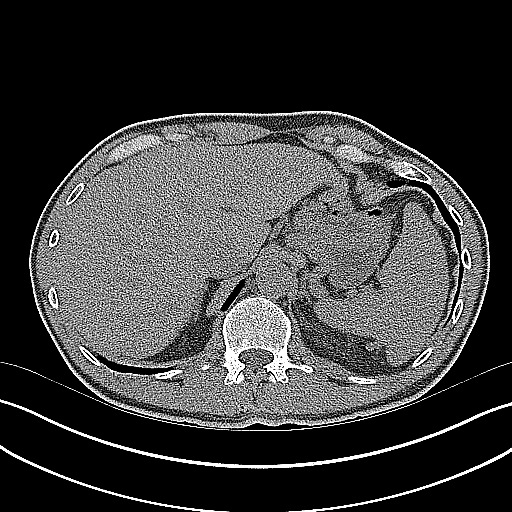
[im 11/55  soft-tissue]
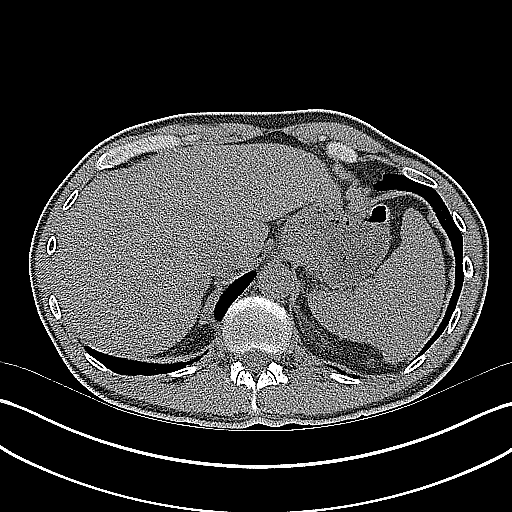
[im 16/55  soft-tissue]
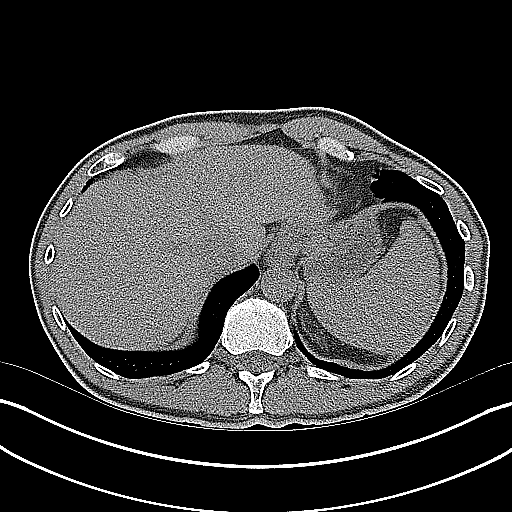
[im 20/55  soft-tissue]
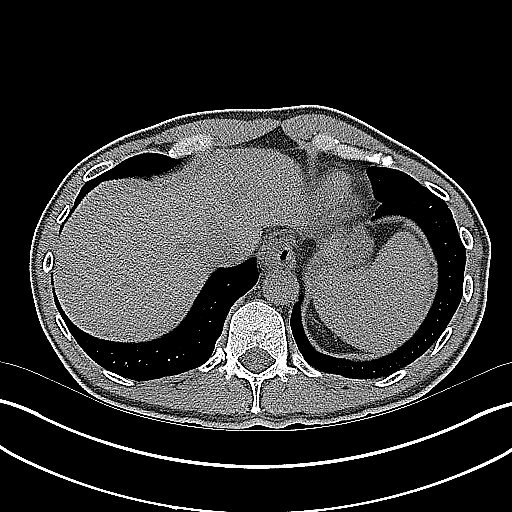
[im 23/55  soft-tissue]
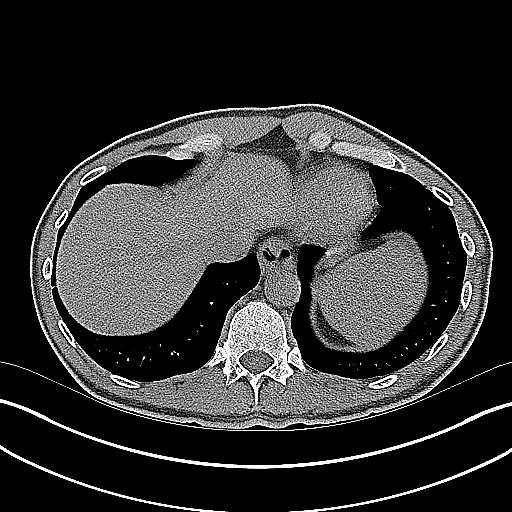
[im 28/55  soft-tissue]
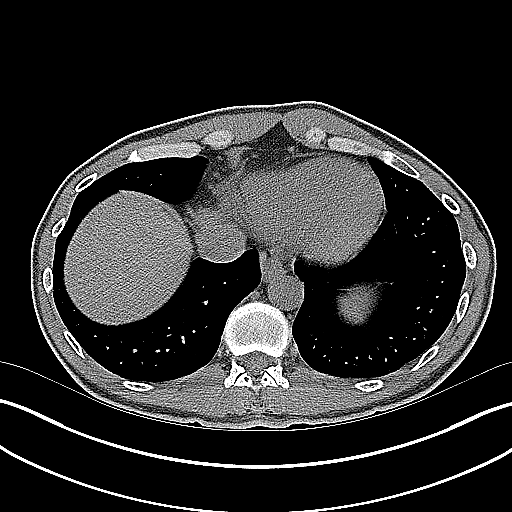
[im 32/55  soft-tissue]
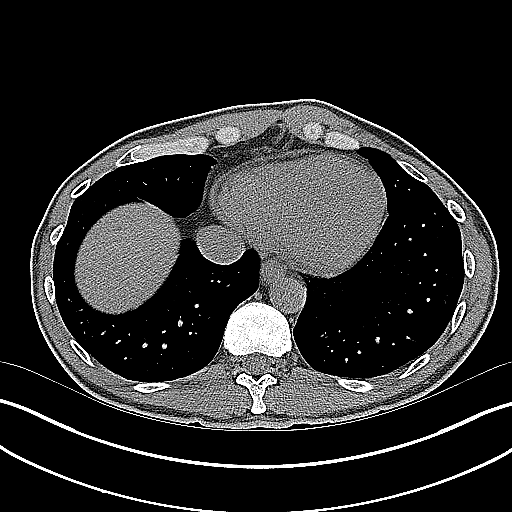
[im 35/55  soft-tissue]
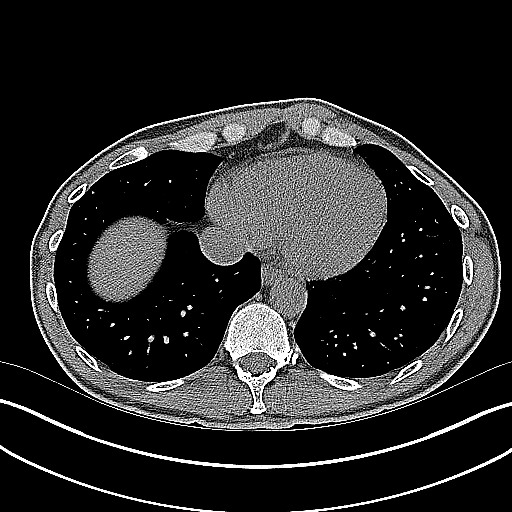
[im 35/55  bone]
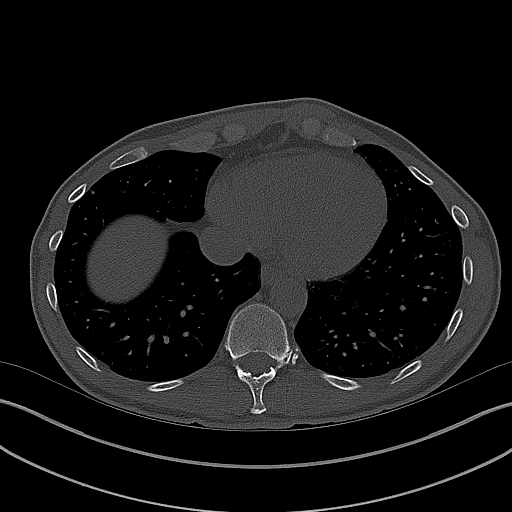
[im 39/55  soft-tissue]
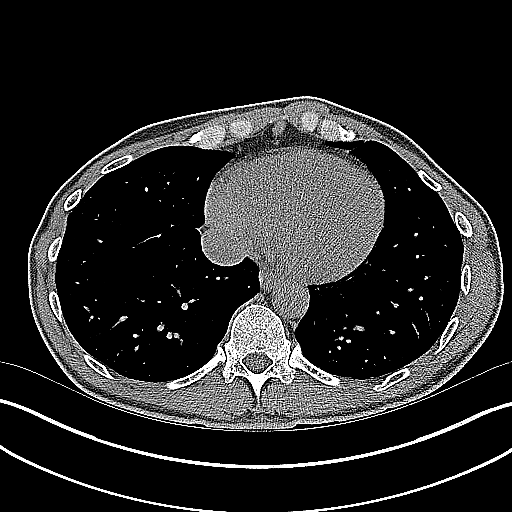
[im 44/55  soft-tissue]
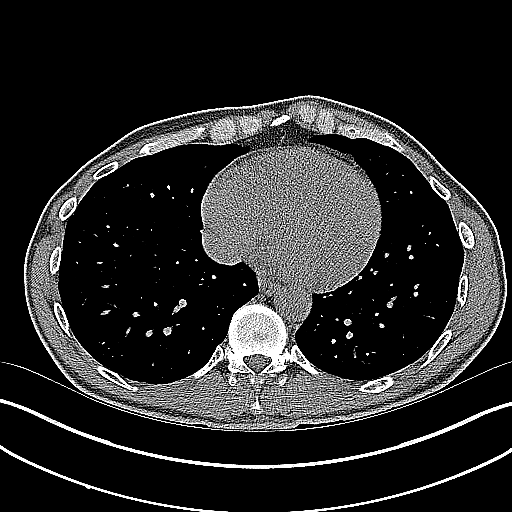
[im 48/55  soft-tissue]
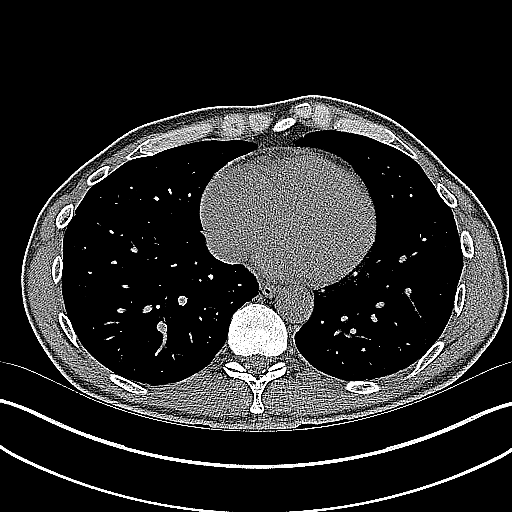
[im 51/55  soft-tissue]
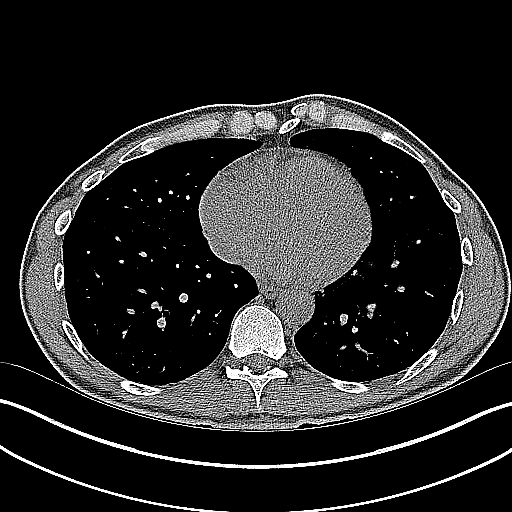

[16 of 46 positions shown; findings below may reference images not displayed]

FINDINGS: Lower chest: No acute abnormality.

Hepatobiliary: No focal liver abnormality is seen. No gallstones,
gallbladder wall thickening, or biliary dilatation.

Pancreas: Unremarkable. No pancreatic ductal dilatation or
surrounding inflammatory changes.

Spleen: Normal in size without focal abnormality.

Adrenals/Urinary Tract: The adrenal glands are within normal limits.
The right kidney shows no calculi or obstructive changes. The left
kidney demonstrates multiple nonobstructing stones similar to that
seen on the prior exam. The degree of hydronephrosis has increased
in the interval from the prior exam and extends distally to the
level of the distal ureter where an 8 mm stone is identified causing
the obstructive change. The bladder is partially distended.

Stomach/Bowel: The appendix is not well visualized although no
inflammatory changes to suggest appendicitis are seen. No
obstructive changes are noted.

Vascular/Lymphatic: No significant vascular findings are present. No
enlarged abdominal or pelvic lymph nodes.

Reproductive: Prostate is unremarkable.

Other: Postoperative changes are noted in the right inguinal region
consistent with prior hernia repair.

Musculoskeletal: No acute bony abnormality is noted.
IMPRESSION: Stable appearing 8 mm distal left ureteral stone with increasing
hydronephrosis when compared with the prior study.

Nonobstructing renal calculi on the left stable in appearance from
the prior study.

No other focal abnormality is noted.
